# Patient Record
Sex: Male | Born: 1945 | Race: White | Hispanic: No | Marital: Married | Smoking: Former smoker
Health system: Southern US, Community
[De-identification: ages and names within clinical notes are randomized; demographics above are authoritative.]

## PROBLEM LIST (undated history)

## (undated) DIAGNOSIS — J45909 Unspecified asthma, uncomplicated: Secondary | ICD-10-CM

## (undated) HISTORY — DX: Unspecified asthma, uncomplicated: J45.909

---

## 1999-06-02 ENCOUNTER — Encounter: Payer: Self-pay | Admitting: Gastroenterology

## 1999-06-02 ENCOUNTER — Ambulatory Visit (HOSPITAL_COMMUNITY): Admission: RE | Admit: 1999-06-02 | Discharge: 1999-06-02 | Payer: Self-pay | Admitting: Gastroenterology

## 1999-12-22 ENCOUNTER — Ambulatory Visit (HOSPITAL_COMMUNITY): Admission: RE | Admit: 1999-12-22 | Discharge: 1999-12-22 | Payer: Self-pay | Admitting: Gastroenterology

## 1999-12-22 ENCOUNTER — Encounter: Payer: Self-pay | Admitting: Gastroenterology

## 2002-11-02 ENCOUNTER — Encounter: Payer: Self-pay | Admitting: Family Medicine

## 2002-11-02 ENCOUNTER — Encounter: Admission: RE | Admit: 2002-11-02 | Discharge: 2002-11-02 | Payer: Self-pay | Admitting: Family Medicine

## 2002-12-04 ENCOUNTER — Ambulatory Visit (HOSPITAL_COMMUNITY): Admission: RE | Admit: 2002-12-04 | Discharge: 2002-12-04 | Payer: Self-pay | Admitting: Gastroenterology

## 2005-06-19 ENCOUNTER — Ambulatory Visit: Payer: Self-pay | Admitting: Pulmonary Disease

## 2005-07-03 ENCOUNTER — Ambulatory Visit (HOSPITAL_BASED_OUTPATIENT_CLINIC_OR_DEPARTMENT_OTHER): Admission: RE | Admit: 2005-07-03 | Discharge: 2005-07-03 | Payer: Self-pay | Admitting: Pulmonary Disease

## 2005-07-19 ENCOUNTER — Ambulatory Visit: Payer: Self-pay | Admitting: Pulmonary Disease

## 2007-01-13 DIAGNOSIS — G4733 Obstructive sleep apnea (adult) (pediatric): Secondary | ICD-10-CM

## 2007-01-13 DIAGNOSIS — E785 Hyperlipidemia, unspecified: Secondary | ICD-10-CM

## 2007-01-13 DIAGNOSIS — K222 Esophageal obstruction: Secondary | ICD-10-CM | POA: Insufficient documentation

## 2007-01-14 ENCOUNTER — Ambulatory Visit: Payer: Self-pay | Admitting: Pulmonary Disease

## 2008-07-14 ENCOUNTER — Encounter: Admission: RE | Admit: 2008-07-14 | Discharge: 2008-07-14 | Payer: Self-pay | Admitting: Family Medicine

## 2009-05-02 ENCOUNTER — Ambulatory Visit: Payer: Self-pay | Admitting: Pulmonary Disease

## 2010-03-07 NOTE — Assessment & Plan Note (Signed)
Summary: rov for osa   CC:  OSA follow-up.  The patient states he is doing well on CPAP. He does need a new machine and mask.Marland Kitchen  History of Present Illness: the pt comes in today for f/u of his known osa.  He has been compliant with cpap, and continues to see benefit wrt sleep and daytime alertness.  He is due for a new machine and supplies.  His weight is stable from the last visit in 2008.  Current Medications (verified): 1)  Zocor 40 Mg  Tabs (Simvastatin) .... Take One Tab By Mouth Once Daily 2)  Zantac 150 Mg  Caps (Ranitidine Hcl) .... Take One Tab By Mouth Once Daily As Needed  Allergies (verified): 1)  ! Penicillin  Review of Systems      See HPI  Vital Signs:  Patient profile:   65 year old male Height:      71 inches (180.34 cm) Weight:      229.50 pounds (104.32 kg) BMI:     32.12 O2 Sat:      94 % on Room air Temp:     97.9 degrees F (36.61 degrees C) oral Pulse rate:   61 / minute BP sitting:   118 / 80  (left arm) Cuff size:   regular  Vitals Entered By: Michel Bickers CMA (May 02, 2009 11:32 AM)  O2 Sat at Rest %:  94 O2 Flow:  Room air  Physical Exam  General:  ow male in nad Nose:  no skin breakdown or pressure necrosis from cpap mask Neurologic:  alert, not sleepy, moves all 4.   Impression & Recommendations:  Problem # 1:  OBSTRUCTIVE SLEEP APNEA (ICD-327.23) the pt is doing well with cpap, but needs to work on weight loss.  He is due for new machine and supplies, and will send an order to his dme.  He will f/u in one year.  Other Orders: Est. Patient Level II (33295) DME Referral (DME)  Patient Instructions: 1)  work on weight loss 2)  will get you a new cpap machine 3)  followup with me in 12mos.   Immunization History:  Influenza Immunization History:    Influenza:  historical (11/05/2008)  Pneumovax Immunization History:    Pneumovax:  historical (11/05/2008)

## 2010-06-23 NOTE — Procedures (Signed)
[place]  Patient:    Keith Harris, Keith Harris                       MRN: [id number] Proc. Date: 06/02/99 Adm. Date:  [id number] Attending:  Louie Bun CC:         Desma Maxim, M.D.                           Procedure Report  PROCEDURE:  Esophagogastroduodenoscopy with esophageal dilatation.  DESCRIPTION OF PROCEDURE:  The patient was placed in the left lateral decubitus  position and placed on the pulse monitor with continuous low flow oxygen delivered by nasal cannula.  He was sedated with 60 mg of IV Demerol and 7 mg of IV Versed. The Olympus video endoscope was advanced under direct vision into the oropharynx and esophagus.  The esophagus was straight and of normal caliber at the squamocolumnar line at 38 cm.  There did appear to be a fairly pronounced short  lower esophageal stricture that did not present significant resistance to passage of the scope beyond it but, when this was done, there was some bleeding from the vicinity of the stricture.  There were no visible erosions, ulcers or areas of exudate.  There was no definite hiatal hernia seen.  The stomach was entered and a small amount of liquid secretions were suctioned from the fundus.  A retroflexed view of the cardia was unremarkable.  The fundus, body, antrum and pylorus all appeared normal.  The duodenum was entered, and both the bulb and second portion were well inspected and appeared to be within normal limits.  The Savary guidewire was passed through the endoscope channel and the scope withdrawn.  Savary dilators of 15 and 16 mm diameter were passed consecutively under fluoroscopic visualization and moderate resistance, with blood seen on the last dilator only.  This dilator was removed together with the wire and the patient returned to the recovery room in stable condition.  He tolerated the procedure well and there were no  immediate complications.  IMPRESSION:  Esophageal stricture status post dilatation to 16 mm.  PLAN:  Consider proton pump inhibitor and, if complete resolution of dysphagia ot achieved within 6-8 weeks, will consider repeat EGD with dilatation to a greater diameter. DD:  06/02/99 TD:  06/02/99 Job: [id number]

## 2010-06-23 NOTE — Op Note (Signed)
[facility]  Patient:    Keith Harris, Keith Harris                       MRN: [id number] Proc. Date: 12/22/99 Adm. Date:  [id number] Attending:  Louie Bun CC:         Desma Maxim, M.D.   Operative Report  PROCEDURE:  Esophagogastroduodenoscopy with esophageal dilatation.  ENDOSCOPIST:  Everardo All. Madilyn Fireman, M.D.  INDICATIONS FOR PROCEDURE:  Patient with history of esophageal stricture dilated to 16 mm two months ago with some recurrent dysphagia, with intent of dilating him to 17 or possibly 18 mm for further improvement in his dysphagia.  DESCRIPTION OF PROCEDURE:   The patient was placed in the left lateral decubitus position and placed on the pulse monitor with continuous low-flow oxygen delivered by nasal cannula.  He was sedated with 80 mg IV Demerol and 7.5 mg IV Versed.  The Olympus video endoscope was advanced under direct vision into the oropharynx and esophagus.  The esophagus was straight and of normal caliber with the squamocolumnar line at 38 cm.  There appeared to be a short fibrotic narrowing that was fairly patent at the GE junction that did not present any resistance to passage of the endoscope tip through it.  There were no visible erosions or ulcers.  No hiatal hernia was appreciated.  The stomach was entered, and a small amount of liquid secretion was suctioned from the fundus.  Retroflex view of the cardia was unremarkable.  The fundus body, antrum, and pylorus all appeared normal.  The duodenum was entered and both bulbs second portion were well inspected and appeared to be within normal limits.  A Savary guidewire was placed through the endoscope channel and the scope withdrawn.  Savary dilators of 16 and 17 mm were passed under fluoroscopic visualization with some blood noted on the second dilator.  That dilator was removed together with the wire, and the patient returned to the recovery room in stable condition.  He tolerated the  procedure well, and there were no immediate complications.  IMPRESSION:   Esophageal stricture dilated to 17 mm.  PLAN:  Advance diet and observe response to dilatation.  Will continue Prevacid for now. DD:  12/22/99 TD:  12/22/99 Job: [id number]

## 2010-06-23 NOTE — Op Note (Signed)
   NAME:  Keith Harris, Keith Harris                          ACCOUNT NO.:  [id number]   MEDICAL RECORD NO.:  [id number]                   PATIENT TYPE:  AMB   LOCATION:  ENDO                                 FACILITY:  [facility]   PHYSICIAN:  John C. Madilyn Fireman, M.D.                 DATE OF BIRTH:  July 20, 1945   DATE OF PROCEDURE:  12/04/2002  DATE OF DISCHARGE:                                 OPERATIVE REPORT   PROCEDURE:  Colonoscopy.   INDICATIONS FOR PROCEDURE:  Colon cancer screening in a 65 year old patient  with no prior screening.   DESCRIPTION OF PROCEDURE:  The patient was placed in the left lateral  decubitus position and placed on the pulse monitor with continuous low-flow  oxygen delivered by nasal cannula.  He was sedated with 25 mcg IV fentanyl  and 2 mg IV Versed in addition to the medicine given for the previous EGD.  The Olympus video colonoscope was inserted into the rectum and advanced to  the cecum, confirmed by transillumination at McBurney's point and  visualization of the ileocecal valve and appendiceal orifice.  The prep was  excellent.  The cecum, ascending, transverse, descending, and sigmoid colon  all appeared normal with no masses, polyps, diverticula, or other mucosal  abnormalities.  The rectum likewise appeared normal, and retroflexed view of  the anus revealed no obvious internal hemorrhoids.  The colonoscope was then  withdrawn, and the patient returned to the recovery room in stable  condition.  He tolerated the procedure well, and there were no immediate  complications.   IMPRESSION:  Normal screening colonoscopy.   PLAN:  Next colon screening by sigmoidoscopy in five years.                                               John C. Madilyn Fireman, M.D.    JCH/MEDQ  D:  12/04/2002  T:  12/04/2002  Job:  [id number]   cc:   Donia Guiles, M.D.  [address] [place]  [place] [address]  Fax: [phone]

## 2010-06-23 NOTE — Op Note (Signed)
   NAME:  Keith Harris, Keith Harris                          ACCOUNT NO.:  [id number]   MEDICAL RECORD NO.:  [id number]                   PATIENT TYPE:  AMB   LOCATION:  ENDO                                 FACILITY:  [facility]   PHYSICIAN:  John C. Madilyn Fireman, M.D.                 DATE OF BIRTH:  [birth year]   DATE OF PROCEDURE:  12/04/2002  DATE OF DISCHARGE:                                 OPERATIVE REPORT   PROCEDURE:  Esophagogastroduodenoscopy.   INDICATION FOR PROCEDURE:  History of esophageal stricture with recurrent  dysphagia.   DESCRIPTION OF PROCEDURE:  The patient was placed in the left lateral  decubitus position and placed on the pulse monitor with continuous low-flow  oxygen delivered by nasal cannula.  He was sedated with 62.5 mcg IV fentanyl  and 7 mg IV Versed.  The Olympus video endoscope was advanced under direct  vision into the oropharynx and esophagus.  The esophagus was straight and of  normal caliber with the squamocolumnar line at 38 cm.  There was a possible  subtle mild narrowing of the GE junction but no visible inflammation or  visible fibrosis.  There was no discrete ring.  The scope passed through the  GE junction without resistance and a small amount of liquid secretions were  suctioned from the fundus.  Retroflexed view of the cardia was unremarkable.  The fundus, body, antrum, and pylorus all appeared normal.  The duodenum was  entered and there was a mild appearance of bulbar duodenitis with no focal  erosions or ulcers.  The postbulbar duodenum appeared normal.  The Savary  guidewire was then placed through the endoscope channel and the scope  withdrawn.  A single 17 mm Savary dilator was passed over the guidewire with  moderate resistance and a small amount of blood seen on withdrawal.  The  dilator was removed together with the wire and the patient returned to the  recovery room in stable condition.  He tolerated the procedure well, and  there were no immediate  complications.   IMPRESSION:  Subtle lower esophageal stricture.   PLAN:  Advance diet and observe response to dilatation, and will proceed  with colonoscopy as scheduled.                                               John C. Madilyn Fireman, M.D.    JCH/MEDQ  D:  12/04/2002  T:  12/04/2002  Job:  [id number]   cc:   Donia Guiles, M.D.  [address] [place]  [place] [address]  Fax: [phone]

## 2010-06-23 NOTE — Procedures (Signed)
NAMEDUSTEN, ELLINWOOD NO.:  [id number]   MEDICAL RECORD NO.:  [id number]          PATIENT TYPE:  OUT   LOCATION:  SLEEP CENTER                 FACILITY:  [facility]   PHYSICIAN:  Marcelyn Bruins, M.D. [facility] DATE OF BIRTH:  Jun 15, 1945   DATE OF STUDY:  07/03/2005                              NOCTURNAL POLYSOMNOGRAM   REFERRING PHYSICIAN:  Dr. Marcelyn Bruins.   INDICATION FOR STUDY:  Hypersomnia with sleep apnea.   EPWORTH SCORE:  13.   SLEEP ARCHITECTURE:  The patient had a total sleep time of 361 minutes with  very little REM or slow wave sleep.  Sleep onset latency was prolonged at 35  minutes and REM onset was very prolonged at 234 minutes.  Sleep efficiency  was decreased 77%.   RESPIRATORY DATA:  The patient underwent split night protocol where he was  found to have 36 obstructive events in the first 148 minutes of sleep.  This  gave him a respiratory disturbance index of 15 events per hour and moderate  snoring was noted throughout.  By protocol, the patient was then placed on a  large ResMed Quattro full face mask and CPAP titration was initiated.  The  patient was titrated as high as 19 cm of water pressure because of  breakthrough snoring; however, the a question was raised regarding its  validity because the patient was adjusting his mask throughout the  titration.   OXYGEN DATA:  The patient had O2 desaturation as low as 89% with his  obstructive events.   CARDIAC DATA:  No clinically significant cardiac arrhythmias.   MOVEMENT-PARASOMNIAS:  The patient was found to have 95 leg jerks with 1.8  per hour resulting in arousal or awakening.   IMPRESSION-RECOMMENDATIONS:  1.  Split night study reveals mild obstructive sleep apnea with a      respiratory disturbance index of 15 events per hour and O2 desaturation      as low as 89%.  The patient was then placed on a large ResMed Quattro      mask and titrated to 19 cm of water pressure to control both events  and      snoring.  The accuracy of the titration is in question due to the      patient adjusting his mask throughout.  The very high pressure needs are      out of her portion to the patient's respiratory disturbance index may be      due to significant mask leaks coming from the patient's adjustments.  I      would suggest either having the patient return for formal titration      versus an auto titrate device      as an outpatient for optimization.  Clinical correlation is suggested.  2.  Moderate numbers of leg jerks with very little sleep disruption.           ______________________________  Marcelyn Bruins, M.D. [facility]  Diplomate, American Board of Sleep  Medicine     KC/MEDQ  D:  07/19/2005 20:51:26  T:  07/19/2005 23:45:38  Job:  [id number]

## 2010-07-19 IMAGING — CR DG CHEST 2V
2 series · 2 of 2 positions shown · non-contrast
Comparison: None

CLINICAL DATA: Cough and fever.

CHEST - 2 VIEW

[w chest pa]
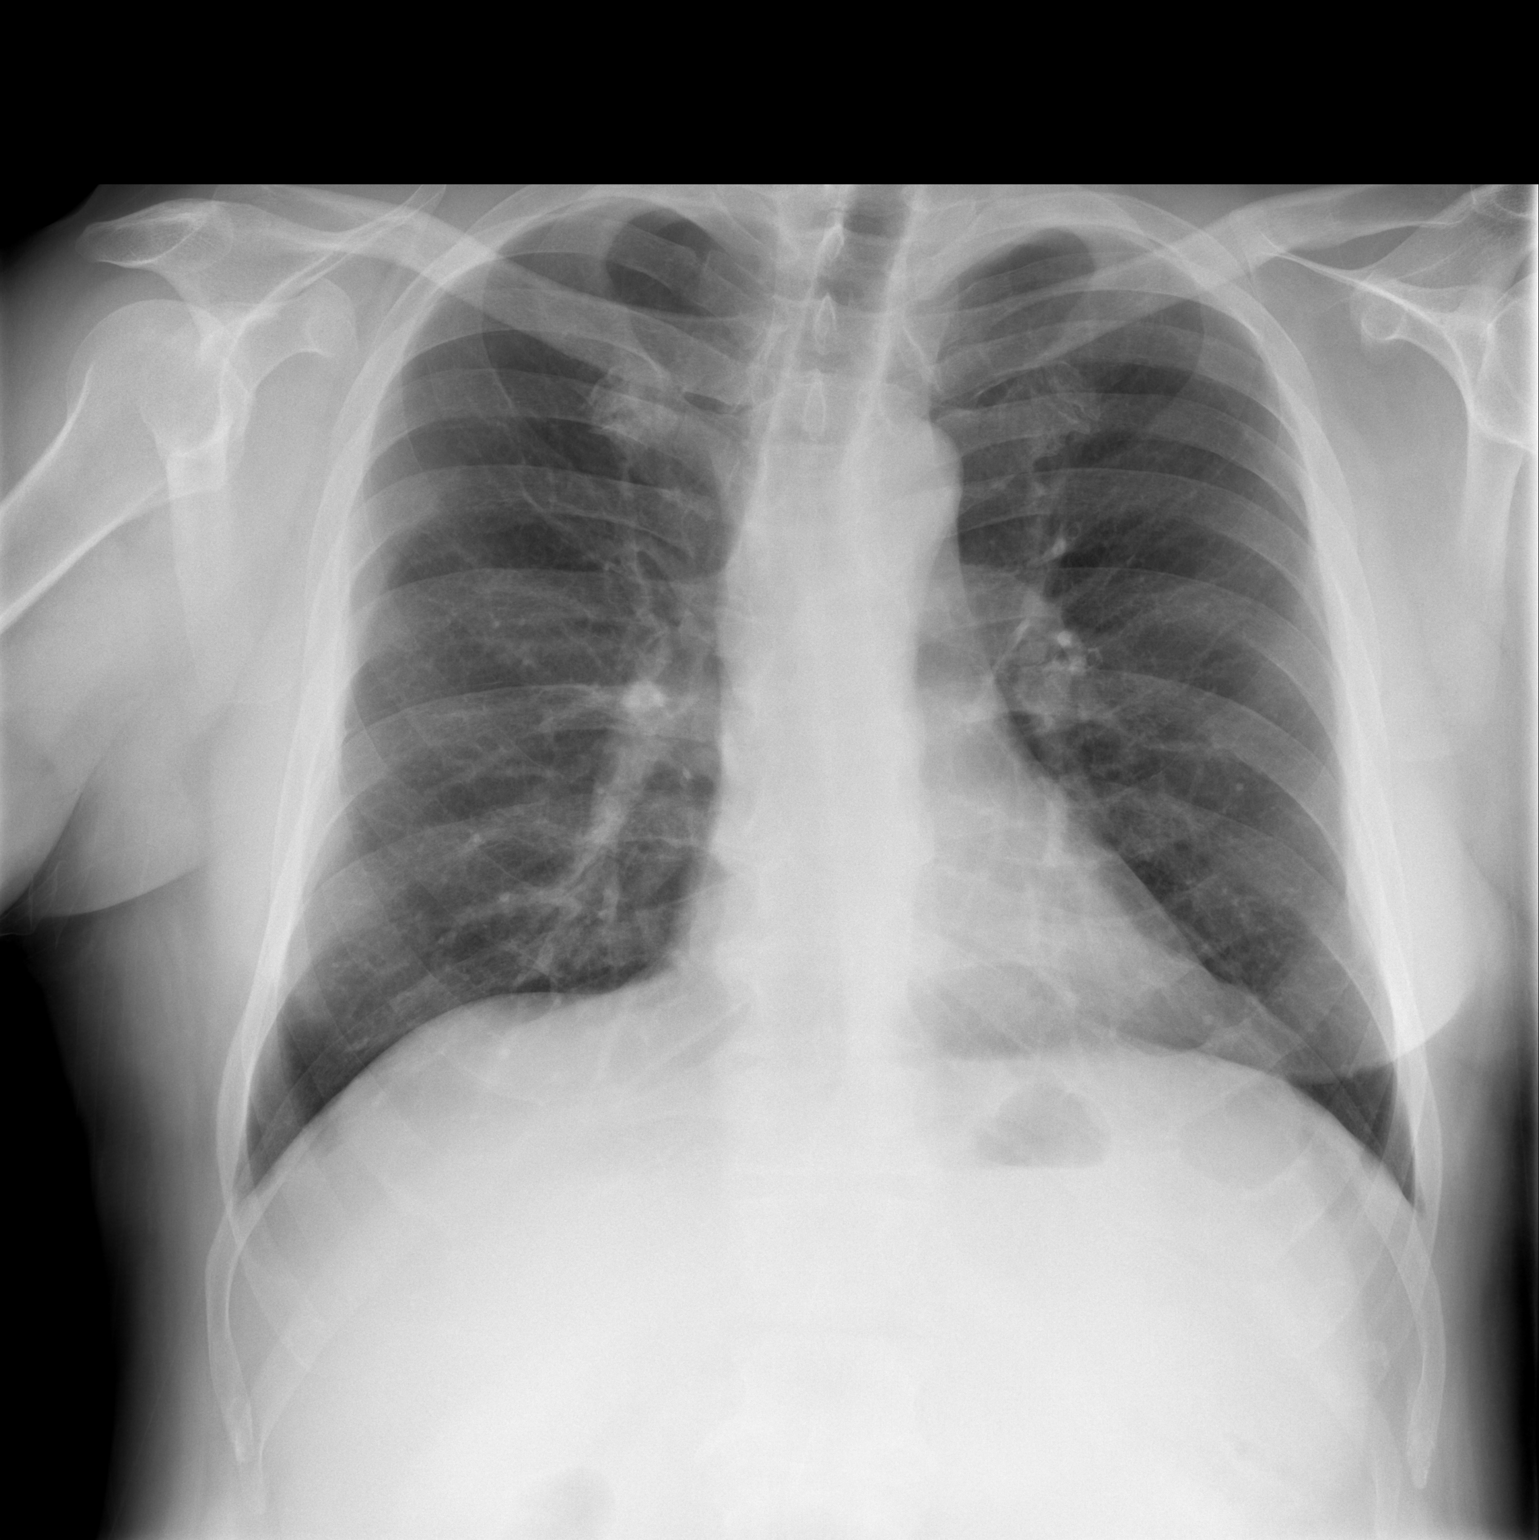

[w chest lat]
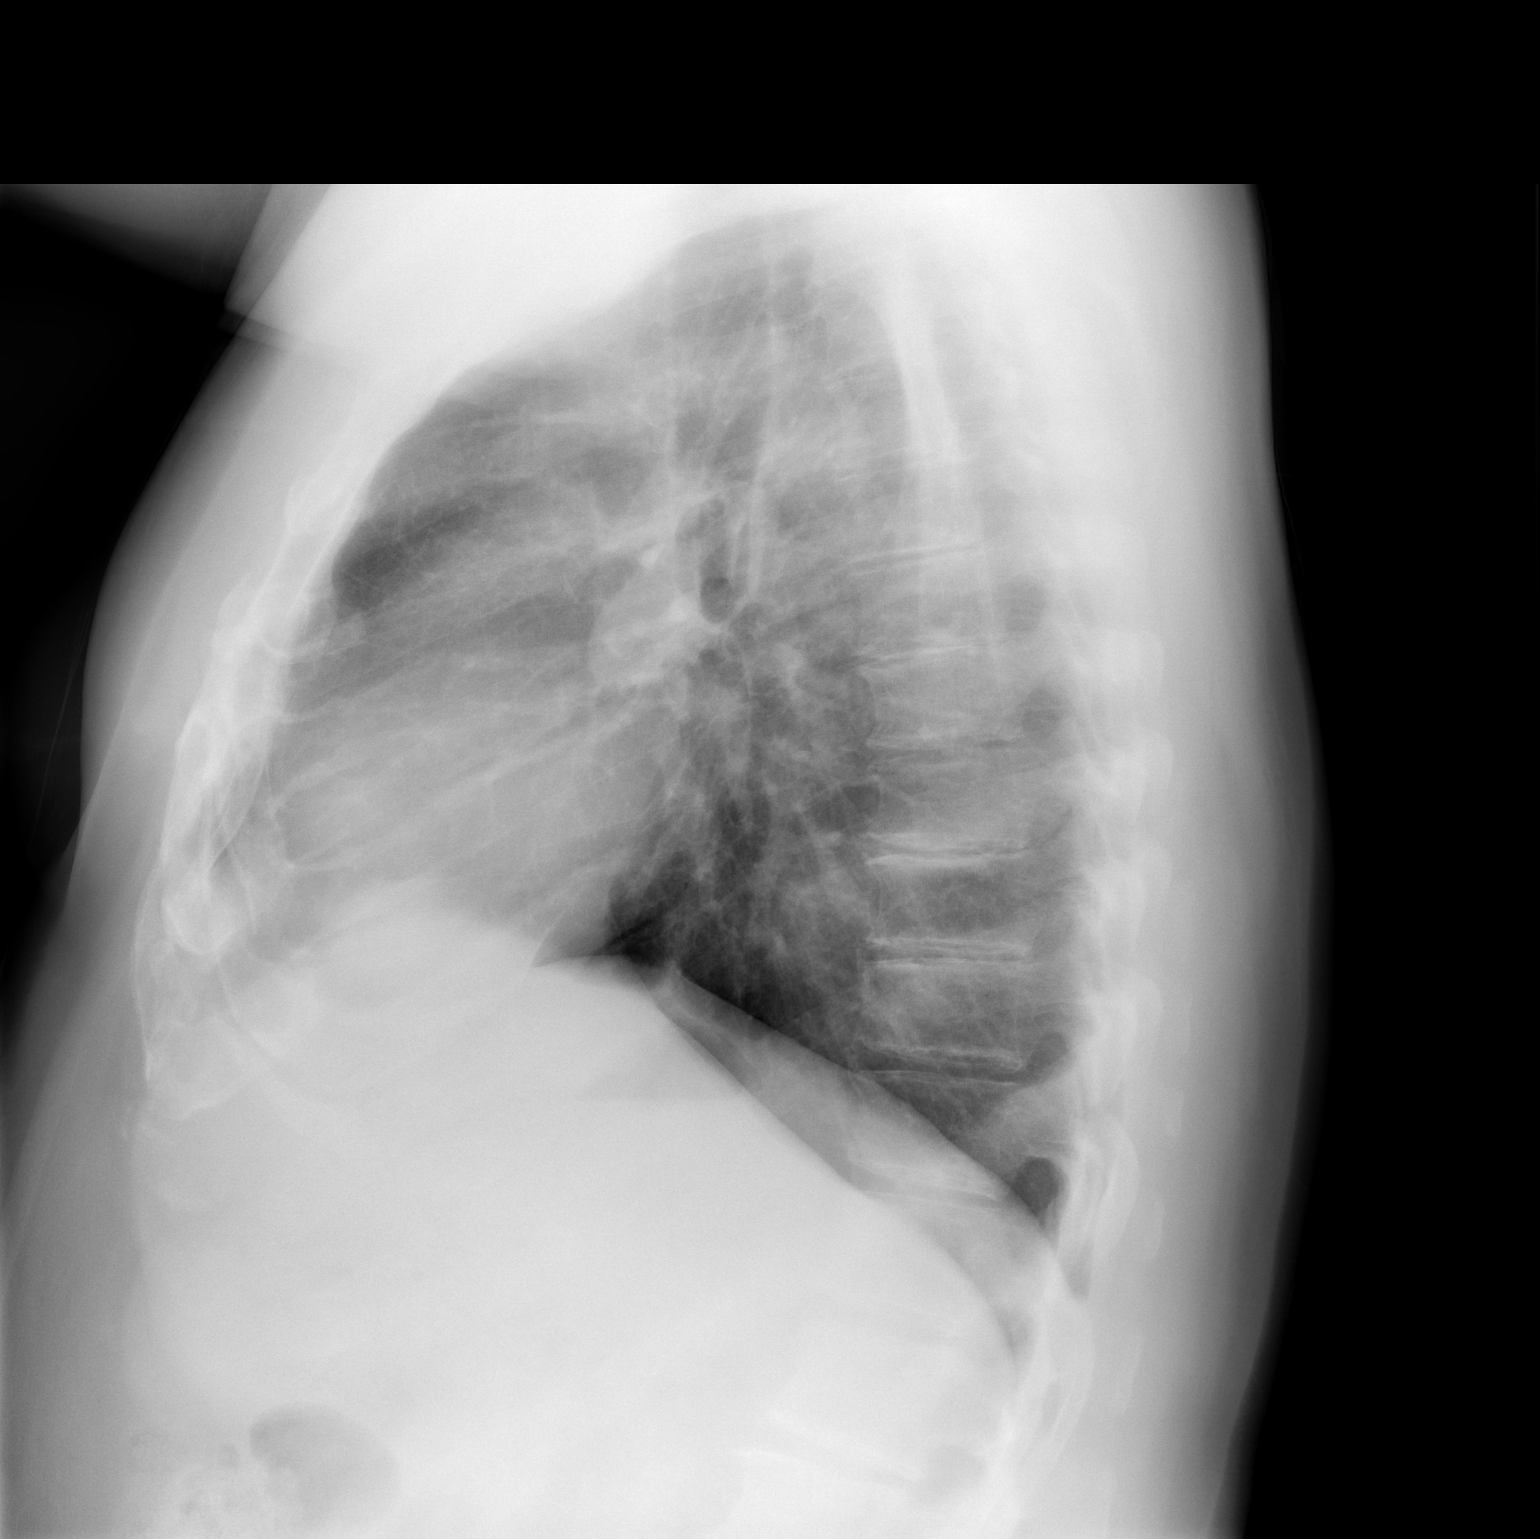

[2 of 2 positions shown; findings below may reference images not displayed]

FINDINGS: Trachea is midline.  Heart size normal.  Lungs are clear.
No pleural fluid.
IMPRESSION: No acute findings.

## 2011-06-18 ENCOUNTER — Telehealth: Payer: Self-pay | Admitting: Pulmonary Disease

## 2011-06-18 NOTE — Telephone Encounter (Signed)
I spoke with natalie and she is requesting pt's last sleep study. He is going to have a pending right knee scope. Pt last OV was 05/02/2009 and told to f/u in 1 year. No pending apts. She would like this sent to [phone] attn: natalie. Please advise KC thanks

## 2011-06-18 NOTE — Telephone Encounter (Signed)
Ok with me 

## 2011-06-19 NOTE — Telephone Encounter (Signed)
Sleep study printed from epic and faxed to the number provided attn Natalie.  Called spoke with Dorene Grebe to make her aware.  Nothing further needed at this time; will sign off.

## 2013-07-27 ENCOUNTER — Ambulatory Visit
Admission: RE | Admit: 2013-07-27 | Discharge: 2013-07-27 | Disposition: A | Discharge status: Home / Self Care | Payer: 59 | Source: Ambulatory Visit | Attending: Family Medicine | Admitting: Family Medicine

## 2013-07-27 ENCOUNTER — Other Ambulatory Visit: Payer: Self-pay | Admitting: Family Medicine

## 2013-07-27 DIAGNOSIS — R059 Cough, unspecified: Secondary | ICD-10-CM

## 2013-07-27 DIAGNOSIS — R05 Cough: Secondary | ICD-10-CM

## 2013-09-03 ENCOUNTER — Ambulatory Visit
Admission: RE | Admit: 2013-09-03 | Discharge: 2013-09-03 | Disposition: A | Discharge status: Home / Self Care | Payer: 59 | Source: Ambulatory Visit | Attending: Family Medicine | Admitting: Family Medicine

## 2013-09-03 ENCOUNTER — Other Ambulatory Visit: Payer: Self-pay | Admitting: Family Medicine

## 2013-09-03 DIAGNOSIS — R634 Abnormal weight loss: Secondary | ICD-10-CM

## 2013-09-03 MED ORDER — IOHEXOL 300 MG/ML  SOLN
100.0000 mL | Freq: Once | INTRAMUSCULAR | Status: AC | PRN
  Administered 2013-09-03: 100 mL via INTRAVENOUS

## 2013-09-14 ENCOUNTER — Telehealth: Payer: Self-pay | Admitting: Pulmonary Disease

## 2013-09-15 NOTE — Telephone Encounter (Signed)
No message taken appt scheduled with RA 8.13.15 Will sign off

## 2013-09-17 ENCOUNTER — Ambulatory Visit (INDEPENDENT_AMBULATORY_CARE_PROVIDER_SITE_OTHER): Payer: 59 | Admitting: Pulmonary Disease

## 2013-09-17 ENCOUNTER — Encounter (INDEPENDENT_AMBULATORY_CARE_PROVIDER_SITE_OTHER): Payer: Self-pay

## 2013-09-17 ENCOUNTER — Encounter: Payer: Self-pay | Admitting: Pulmonary Disease

## 2013-09-17 VITALS — BP 134/72 | HR 81 | Temp 98.0°F | Ht 71.0 in | Wt 204.0 lb

## 2013-09-17 DIAGNOSIS — G4733 Obstructive sleep apnea (adult) (pediatric): Secondary | ICD-10-CM

## 2013-09-17 DIAGNOSIS — R059 Cough, unspecified: Secondary | ICD-10-CM

## 2013-09-17 DIAGNOSIS — R05 Cough: Secondary | ICD-10-CM

## 2013-09-17 DIAGNOSIS — R053 Chronic cough: Secondary | ICD-10-CM | POA: Insufficient documentation

## 2013-09-17 MED ORDER — DEXLANSOPRAZOLE 60 MG PO CPDR: 60.0000 mg | DELAYED_RELEASE_CAPSULE | Freq: Every day | ORAL | Status: DC

## 2013-09-17 MED ORDER — NYSTATIN 100000 UNIT/ML MT SUSP: 5.0000 mL | Freq: Two times a day (BID) | OROMUCOSAL | Status: AC

## 2013-09-17 NOTE — Assessment & Plan Note (Signed)
New fullface mask was ordered
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
## 2013-09-17 NOTE — Assessment & Plan Note (Signed)
Your cough is likely due to reflux, may be sinus -related STOP symbicort Nystatin 5 ml swish & swallow for your hoarse voice Take DEXILANT daily am -Rx will be sent Take zantac daily bedtime We discussed other measures for reflux Take DELSYM cough syrup twice daily (daytime ) as needed  rx for new full face mask will be sent to [facility]

## 2013-09-17 NOTE — Patient Instructions (Signed)
Your cough is likely due to reflux, may be sinus -related STOP symbicort Nystatin 5 ml swish & swallow for your hoarse voice Take DEXILANT daily am -Rx will be sent Take zantac daily bedtime We discussed other measures for reflux Take DELSYM cough syrup twice daily (daytime ) as needed OK to take codeine cough syrup at bedtime rx for new full face mask will be sent to [facility]

## 2013-10-16 ENCOUNTER — Institutional Professional Consult (permissible substitution): Payer: 59 | Admitting: Pulmonary Disease

## 2013-11-28 ENCOUNTER — Other Ambulatory Visit: Payer: Self-pay | Admitting: Pulmonary Disease

## 2015-08-01 IMAGING — CR DG CHEST 2V
2 series · 2 of 2 positions shown · non-contrast
Comparison: Two-view chest 07/14/2008

CLINICAL DATA: Cough.  Chest congestion.  Fever.

EXAM:
CHEST  2 VIEW

[view not recorded (1 of 2)]
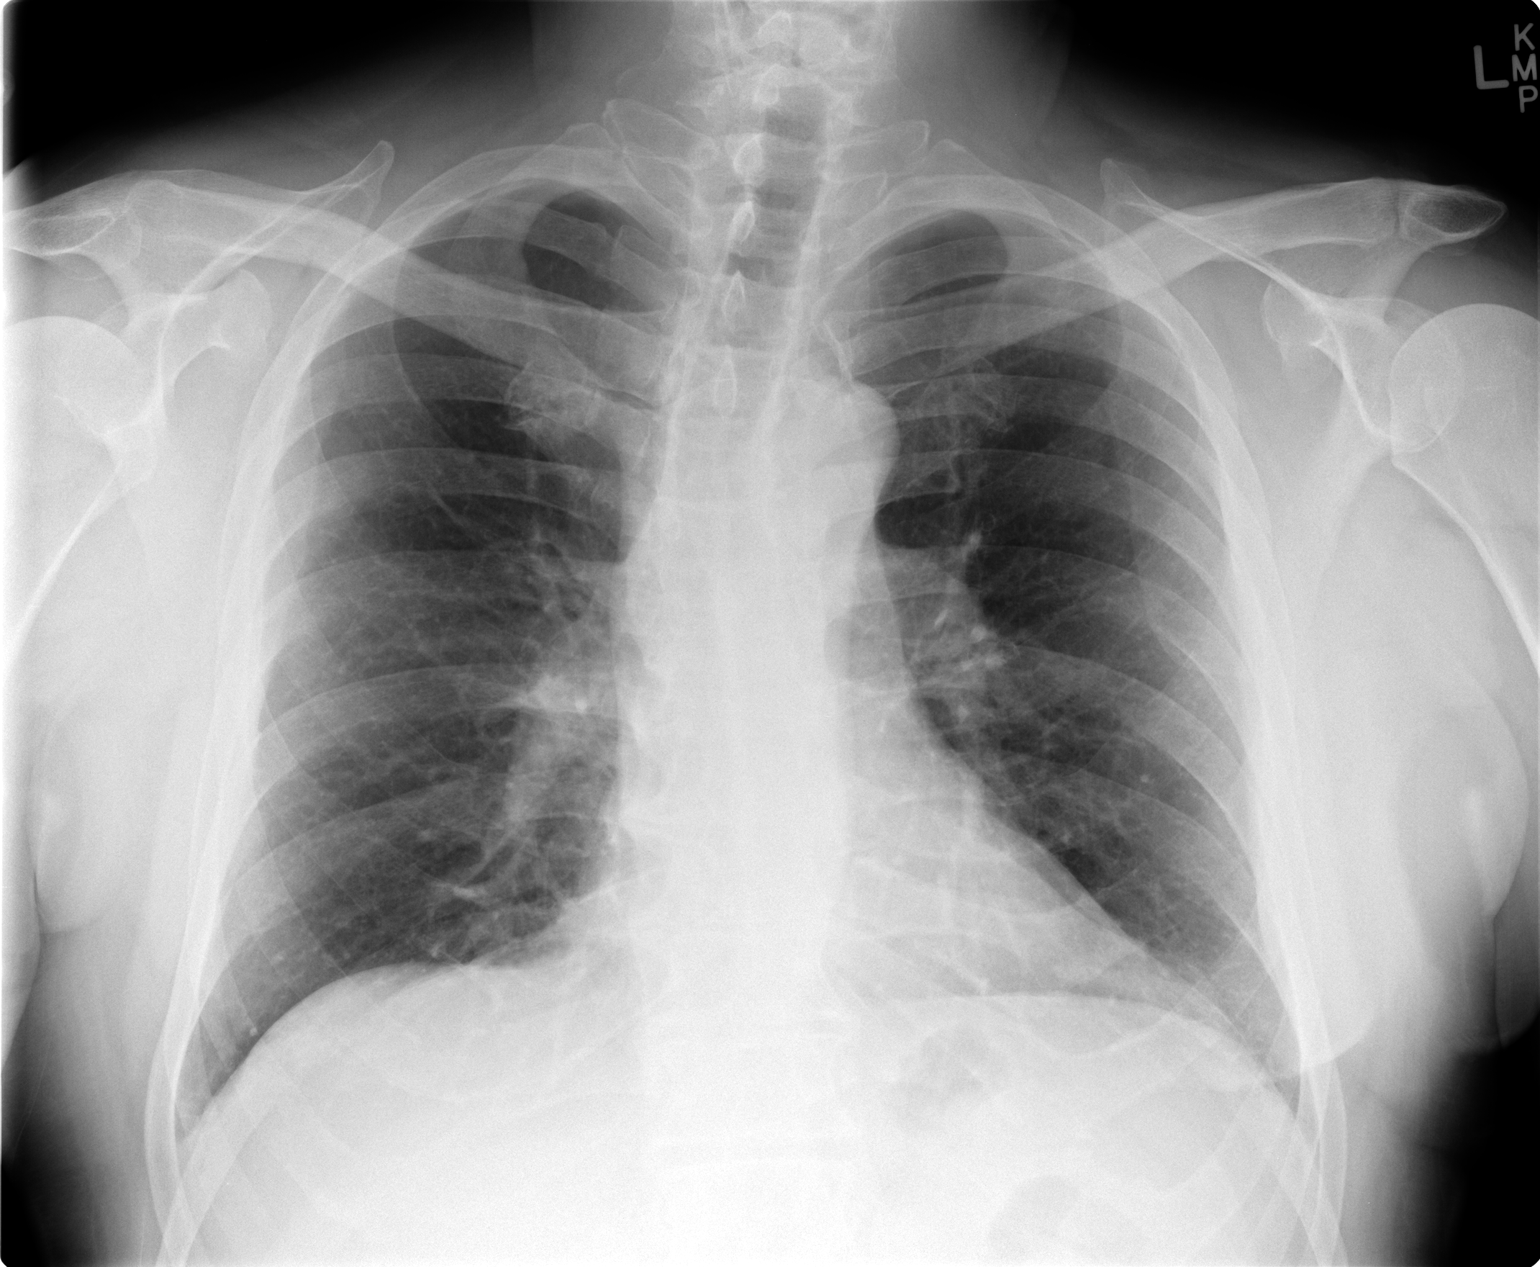

[view not recorded (2 of 2)]
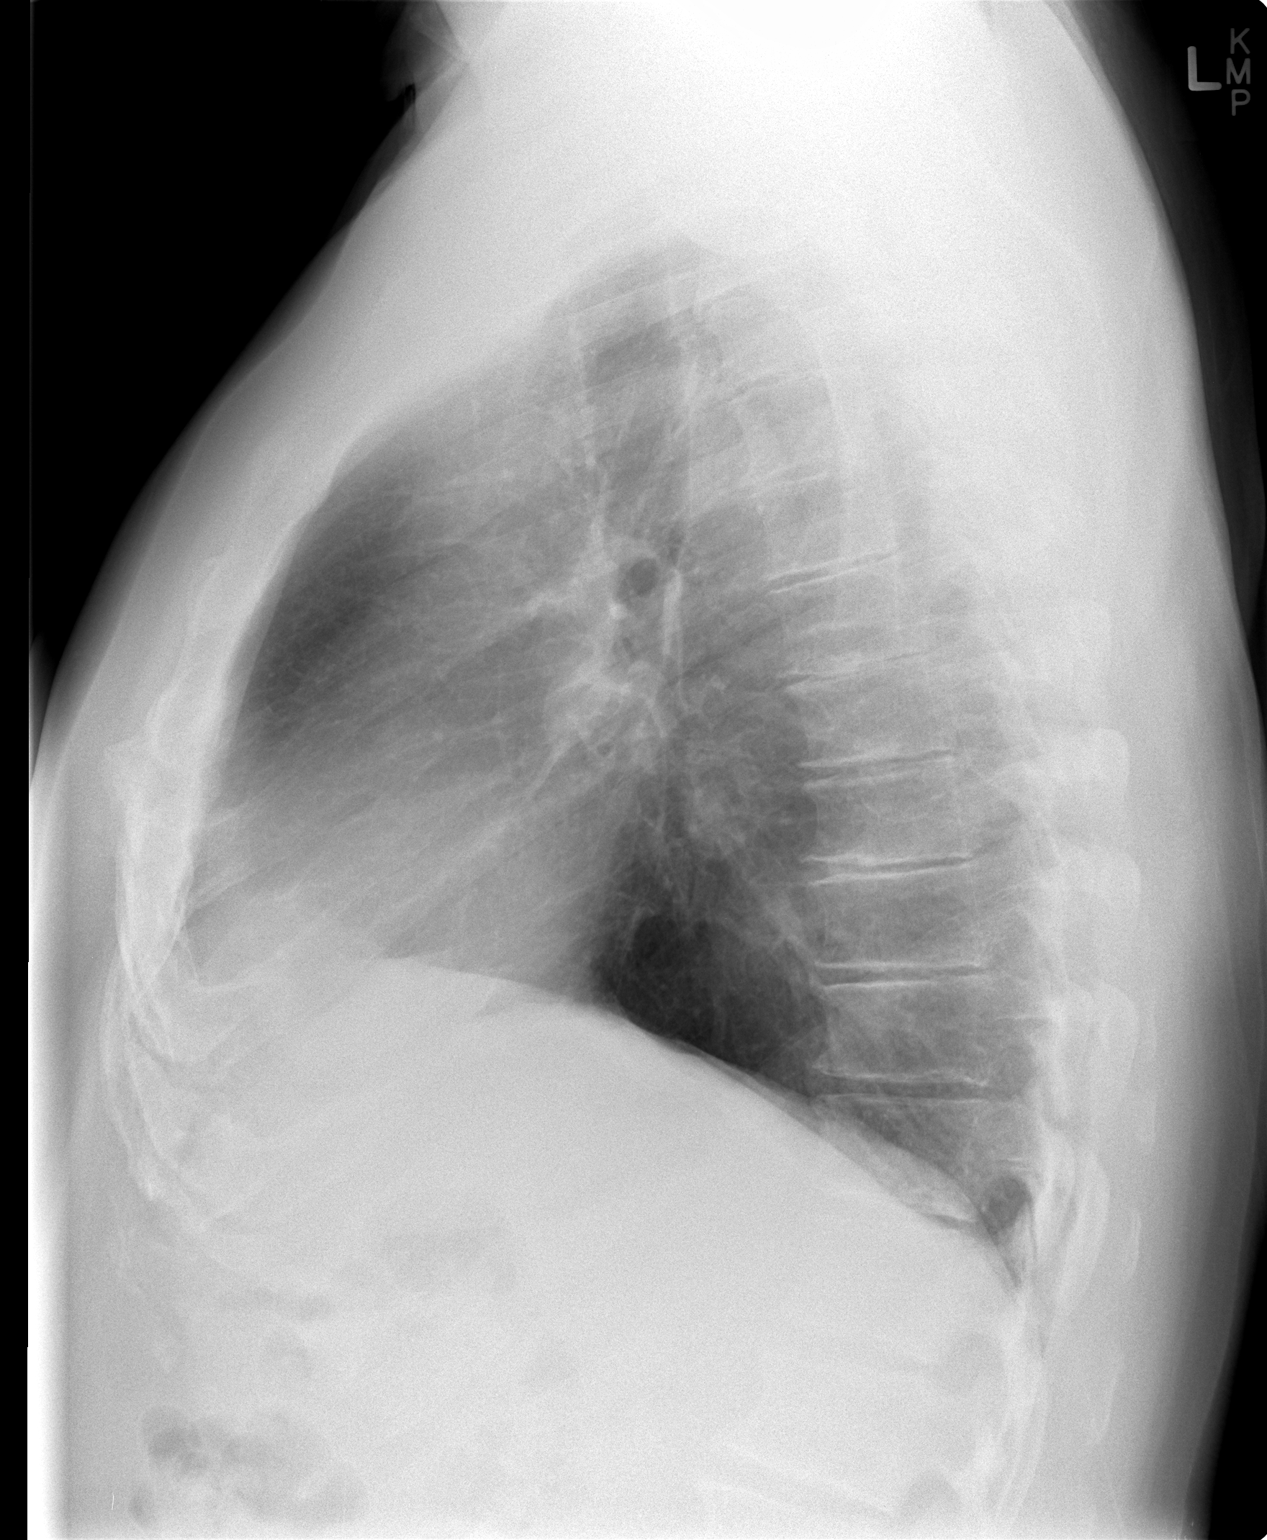

[2 of 2 positions shown; findings below may reference images not displayed]

FINDINGS: The heart size is normal. The lungs are clear. There is some scratch
the mild emphysematous changes are noted. Degenerative changes of
the thoracic spine show slight progression.
IMPRESSION: 1. No acute cardiopulmonary disease.
2. Stable emphysematous change.

## 2015-12-28 ENCOUNTER — Other Ambulatory Visit: Payer: Self-pay | Admitting: Urology

## 2016-01-23 ENCOUNTER — Encounter (HOSPITAL_BASED_OUTPATIENT_CLINIC_OR_DEPARTMENT_OTHER): Admission: RE | Payer: Self-pay | Source: Ambulatory Visit

## 2016-01-23 ENCOUNTER — Ambulatory Visit (HOSPITAL_BASED_OUTPATIENT_CLINIC_OR_DEPARTMENT_OTHER): Admission: RE | Admit: 2016-01-23 | Payer: 59 | Source: Ambulatory Visit | Admitting: Urology

## 2016-01-23 SURGERY — NESBIT PROCEDURE
Anesthesia: General

## 2016-09-04 DIAGNOSIS — E782 Mixed hyperlipidemia: Secondary | ICD-10-CM | POA: Diagnosis not present

## 2016-09-04 DIAGNOSIS — R7301 Impaired fasting glucose: Secondary | ICD-10-CM | POA: Diagnosis not present

## 2016-09-04 DIAGNOSIS — E669 Obesity, unspecified: Secondary | ICD-10-CM | POA: Diagnosis not present

## 2017-03-20 DIAGNOSIS — Z125 Encounter for screening for malignant neoplasm of prostate: Secondary | ICD-10-CM | POA: Diagnosis not present

## 2017-03-20 DIAGNOSIS — Z6832 Body mass index (BMI) 32.0-32.9, adult: Secondary | ICD-10-CM | POA: Diagnosis not present

## 2017-03-20 DIAGNOSIS — R972 Elevated prostate specific antigen [PSA]: Secondary | ICD-10-CM | POA: Diagnosis not present

## 2017-03-20 DIAGNOSIS — E669 Obesity, unspecified: Secondary | ICD-10-CM | POA: Diagnosis not present

## 2017-03-20 DIAGNOSIS — E782 Mixed hyperlipidemia: Secondary | ICD-10-CM | POA: Diagnosis not present

## 2017-03-20 DIAGNOSIS — N400 Enlarged prostate without lower urinary tract symptoms: Secondary | ICD-10-CM | POA: Diagnosis not present

## 2017-03-20 DIAGNOSIS — N486 Induration penis plastica: Secondary | ICD-10-CM | POA: Diagnosis not present

## 2017-03-20 DIAGNOSIS — Z23 Encounter for immunization: Secondary | ICD-10-CM | POA: Diagnosis not present

## 2017-03-20 DIAGNOSIS — R7301 Impaired fasting glucose: Secondary | ICD-10-CM | POA: Diagnosis not present

## 2017-03-20 DIAGNOSIS — Z1159 Encounter for screening for other viral diseases: Secondary | ICD-10-CM | POA: Diagnosis not present

## 2017-03-20 DIAGNOSIS — Z0000 Encounter for general adult medical examination without abnormal findings: Secondary | ICD-10-CM | POA: Diagnosis not present

## 2017-04-08 DIAGNOSIS — Z1159 Encounter for screening for other viral diseases: Secondary | ICD-10-CM | POA: Diagnosis not present

## 2017-06-26 DIAGNOSIS — E7800 Pure hypercholesterolemia, unspecified: Secondary | ICD-10-CM | POA: Diagnosis not present

## 2017-06-26 DIAGNOSIS — R7301 Impaired fasting glucose: Secondary | ICD-10-CM | POA: Diagnosis not present

## 2017-10-26 DIAGNOSIS — Z23 Encounter for immunization: Secondary | ICD-10-CM | POA: Diagnosis not present

## 2018-02-18 DIAGNOSIS — Z6832 Body mass index (BMI) 32.0-32.9, adult: Secondary | ICD-10-CM | POA: Diagnosis not present

## 2018-02-18 DIAGNOSIS — E782 Mixed hyperlipidemia: Secondary | ICD-10-CM | POA: Diagnosis not present

## 2018-02-18 DIAGNOSIS — E669 Obesity, unspecified: Secondary | ICD-10-CM | POA: Diagnosis not present

## 2018-02-18 DIAGNOSIS — R7301 Impaired fasting glucose: Secondary | ICD-10-CM | POA: Diagnosis not present

## 2018-08-12 DIAGNOSIS — Z125 Encounter for screening for malignant neoplasm of prostate: Secondary | ICD-10-CM | POA: Diagnosis not present

## 2018-08-12 DIAGNOSIS — E782 Mixed hyperlipidemia: Secondary | ICD-10-CM | POA: Diagnosis not present

## 2018-08-12 DIAGNOSIS — R7301 Impaired fasting glucose: Secondary | ICD-10-CM | POA: Diagnosis not present

## 2018-08-14 DIAGNOSIS — K449 Diaphragmatic hernia without obstruction or gangrene: Secondary | ICD-10-CM | POA: Diagnosis not present

## 2018-08-14 DIAGNOSIS — Z7189 Other specified counseling: Secondary | ICD-10-CM | POA: Diagnosis not present

## 2018-08-14 DIAGNOSIS — Z6831 Body mass index (BMI) 31.0-31.9, adult: Secondary | ICD-10-CM | POA: Diagnosis not present

## 2018-08-14 DIAGNOSIS — N486 Induration penis plastica: Secondary | ICD-10-CM | POA: Diagnosis not present

## 2018-08-14 DIAGNOSIS — Z0000 Encounter for general adult medical examination without abnormal findings: Secondary | ICD-10-CM | POA: Diagnosis not present

## 2018-08-14 DIAGNOSIS — E782 Mixed hyperlipidemia: Secondary | ICD-10-CM | POA: Diagnosis not present

## 2018-08-14 DIAGNOSIS — E669 Obesity, unspecified: Secondary | ICD-10-CM | POA: Diagnosis not present

## 2018-08-14 DIAGNOSIS — Z125 Encounter for screening for malignant neoplasm of prostate: Secondary | ICD-10-CM | POA: Diagnosis not present

## 2018-08-14 DIAGNOSIS — N400 Enlarged prostate without lower urinary tract symptoms: Secondary | ICD-10-CM | POA: Diagnosis not present

## 2018-08-14 DIAGNOSIS — R7301 Impaired fasting glucose: Secondary | ICD-10-CM | POA: Diagnosis not present

## 2019-02-17 DIAGNOSIS — Z6831 Body mass index (BMI) 31.0-31.9, adult: Secondary | ICD-10-CM | POA: Diagnosis not present

## 2019-02-17 DIAGNOSIS — E669 Obesity, unspecified: Secondary | ICD-10-CM | POA: Diagnosis not present

## 2019-02-17 DIAGNOSIS — E782 Mixed hyperlipidemia: Secondary | ICD-10-CM | POA: Diagnosis not present

## 2019-02-17 DIAGNOSIS — R7301 Impaired fasting glucose: Secondary | ICD-10-CM | POA: Diagnosis not present

## 2019-02-17 DIAGNOSIS — Z7189 Other specified counseling: Secondary | ICD-10-CM | POA: Diagnosis not present

## 2019-02-17 DIAGNOSIS — R030 Elevated blood-pressure reading, without diagnosis of hypertension: Secondary | ICD-10-CM | POA: Diagnosis not present

## 2019-02-18 DIAGNOSIS — R7309 Other abnormal glucose: Secondary | ICD-10-CM | POA: Diagnosis not present

## 2019-02-18 DIAGNOSIS — E782 Mixed hyperlipidemia: Secondary | ICD-10-CM | POA: Diagnosis not present

## 2019-03-10 DIAGNOSIS — Z87891 Personal history of nicotine dependence: Secondary | ICD-10-CM | POA: Diagnosis not present

## 2019-03-10 DIAGNOSIS — Z809 Family history of malignant neoplasm, unspecified: Secondary | ICD-10-CM | POA: Diagnosis not present

## 2019-03-10 DIAGNOSIS — E785 Hyperlipidemia, unspecified: Secondary | ICD-10-CM | POA: Diagnosis not present

## 2019-03-10 DIAGNOSIS — K219 Gastro-esophageal reflux disease without esophagitis: Secondary | ICD-10-CM | POA: Diagnosis not present

## 2019-03-10 DIAGNOSIS — R030 Elevated blood-pressure reading, without diagnosis of hypertension: Secondary | ICD-10-CM | POA: Diagnosis not present

## 2019-03-10 DIAGNOSIS — I951 Orthostatic hypotension: Secondary | ICD-10-CM | POA: Diagnosis not present

## 2019-03-10 DIAGNOSIS — Z823 Family history of stroke: Secondary | ICD-10-CM | POA: Diagnosis not present

## 2019-03-10 DIAGNOSIS — E669 Obesity, unspecified: Secondary | ICD-10-CM | POA: Diagnosis not present

## 2019-03-10 DIAGNOSIS — Z880 Allergy status to penicillin: Secondary | ICD-10-CM | POA: Diagnosis not present

## 2019-03-10 DIAGNOSIS — Z803 Family history of malignant neoplasm of breast: Secondary | ICD-10-CM | POA: Diagnosis not present

## 2019-03-19 DIAGNOSIS — Z23 Encounter for immunization: Secondary | ICD-10-CM | POA: Diagnosis not present

## 2019-08-19 DIAGNOSIS — Z0000 Encounter for general adult medical examination without abnormal findings: Secondary | ICD-10-CM | POA: Diagnosis not present

## 2019-08-19 DIAGNOSIS — E669 Obesity, unspecified: Secondary | ICD-10-CM | POA: Diagnosis not present

## 2019-08-19 DIAGNOSIS — Z125 Encounter for screening for malignant neoplasm of prostate: Secondary | ICD-10-CM | POA: Diagnosis not present

## 2019-08-19 DIAGNOSIS — N400 Enlarged prostate without lower urinary tract symptoms: Secondary | ICD-10-CM | POA: Diagnosis not present

## 2019-08-19 DIAGNOSIS — R7309 Other abnormal glucose: Secondary | ICD-10-CM | POA: Diagnosis not present

## 2019-08-19 DIAGNOSIS — E782 Mixed hyperlipidemia: Secondary | ICD-10-CM | POA: Diagnosis not present

## 2019-08-19 DIAGNOSIS — K449 Diaphragmatic hernia without obstruction or gangrene: Secondary | ICD-10-CM | POA: Diagnosis not present

## 2020-01-27 DIAGNOSIS — R69 Illness, unspecified: Secondary | ICD-10-CM | POA: Diagnosis not present

## 2020-02-19 DIAGNOSIS — E669 Obesity, unspecified: Secondary | ICD-10-CM | POA: Diagnosis not present

## 2020-02-19 DIAGNOSIS — R7301 Impaired fasting glucose: Secondary | ICD-10-CM | POA: Diagnosis not present

## 2020-02-19 DIAGNOSIS — E782 Mixed hyperlipidemia: Secondary | ICD-10-CM | POA: Diagnosis not present

## 2020-05-18 DIAGNOSIS — H5200 Hypermetropia, unspecified eye: Secondary | ICD-10-CM | POA: Diagnosis not present

## 2020-05-18 DIAGNOSIS — Z0100 Encounter for examination of eyes and vision without abnormal findings: Secondary | ICD-10-CM | POA: Diagnosis not present

## 2020-08-24 DIAGNOSIS — R972 Elevated prostate specific antigen [PSA]: Secondary | ICD-10-CM | POA: Diagnosis not present

## 2020-08-24 DIAGNOSIS — L989 Disorder of the skin and subcutaneous tissue, unspecified: Secondary | ICD-10-CM | POA: Diagnosis not present

## 2020-08-24 DIAGNOSIS — M25562 Pain in left knee: Secondary | ICD-10-CM | POA: Diagnosis not present

## 2020-08-24 DIAGNOSIS — E782 Mixed hyperlipidemia: Secondary | ICD-10-CM | POA: Diagnosis not present

## 2020-08-24 DIAGNOSIS — K449 Diaphragmatic hernia without obstruction or gangrene: Secondary | ICD-10-CM | POA: Diagnosis not present

## 2020-08-24 DIAGNOSIS — Z0000 Encounter for general adult medical examination without abnormal findings: Secondary | ICD-10-CM | POA: Diagnosis not present

## 2020-08-24 DIAGNOSIS — R7301 Impaired fasting glucose: Secondary | ICD-10-CM | POA: Diagnosis not present

## 2020-08-24 DIAGNOSIS — Z125 Encounter for screening for malignant neoplasm of prostate: Secondary | ICD-10-CM | POA: Diagnosis not present

## 2020-08-24 DIAGNOSIS — Z1211 Encounter for screening for malignant neoplasm of colon: Secondary | ICD-10-CM | POA: Diagnosis not present

## 2020-08-24 DIAGNOSIS — N400 Enlarged prostate without lower urinary tract symptoms: Secondary | ICD-10-CM | POA: Diagnosis not present

## 2020-08-24 DIAGNOSIS — E669 Obesity, unspecified: Secondary | ICD-10-CM | POA: Diagnosis not present

## 2020-08-29 DIAGNOSIS — M25562 Pain in left knee: Secondary | ICD-10-CM | POA: Diagnosis not present

## 2020-08-30 ENCOUNTER — Ambulatory Visit
Admission: RE | Admit: 2020-08-30 | Discharge: 2020-08-30 | Disposition: A | Discharge status: Home / Self Care | Payer: Medicare HMO | Source: Ambulatory Visit | Attending: Sports Medicine | Admitting: Sports Medicine

## 2020-08-30 ENCOUNTER — Other Ambulatory Visit: Payer: Self-pay | Admitting: Sports Medicine

## 2020-08-30 ENCOUNTER — Other Ambulatory Visit: Payer: Self-pay

## 2020-08-30 DIAGNOSIS — M25562 Pain in left knee: Secondary | ICD-10-CM | POA: Diagnosis not present

## 2020-09-07 DIAGNOSIS — K219 Gastro-esophageal reflux disease without esophagitis: Secondary | ICD-10-CM | POA: Diagnosis not present

## 2020-09-07 DIAGNOSIS — Z86010 Personal history of colonic polyps: Secondary | ICD-10-CM | POA: Diagnosis not present

## 2020-09-07 DIAGNOSIS — K5900 Constipation, unspecified: Secondary | ICD-10-CM | POA: Diagnosis not present

## 2020-09-29 DIAGNOSIS — D123 Benign neoplasm of transverse colon: Secondary | ICD-10-CM | POA: Diagnosis not present

## 2020-09-29 DIAGNOSIS — K5730 Diverticulosis of large intestine without perforation or abscess without bleeding: Secondary | ICD-10-CM | POA: Diagnosis not present

## 2020-09-29 DIAGNOSIS — K635 Polyp of colon: Secondary | ICD-10-CM | POA: Diagnosis not present

## 2020-09-29 DIAGNOSIS — Z86010 Personal history of colonic polyps: Secondary | ICD-10-CM | POA: Diagnosis not present

## 2020-10-10 DIAGNOSIS — U071 COVID-19: Secondary | ICD-10-CM | POA: Diagnosis not present

## 2021-02-24 DIAGNOSIS — R7301 Impaired fasting glucose: Secondary | ICD-10-CM | POA: Diagnosis not present

## 2021-02-24 DIAGNOSIS — E782 Mixed hyperlipidemia: Secondary | ICD-10-CM | POA: Diagnosis not present

## 2021-02-24 DIAGNOSIS — R972 Elevated prostate specific antigen [PSA]: Secondary | ICD-10-CM | POA: Diagnosis not present

## 2021-02-24 DIAGNOSIS — E669 Obesity, unspecified: Secondary | ICD-10-CM | POA: Diagnosis not present

## 2021-08-29 DIAGNOSIS — Z0000 Encounter for general adult medical examination without abnormal findings: Secondary | ICD-10-CM | POA: Diagnosis not present

## 2021-08-29 DIAGNOSIS — Z125 Encounter for screening for malignant neoplasm of prostate: Secondary | ICD-10-CM | POA: Diagnosis not present

## 2021-08-29 DIAGNOSIS — R7301 Impaired fasting glucose: Secondary | ICD-10-CM | POA: Diagnosis not present

## 2021-08-29 DIAGNOSIS — E669 Obesity, unspecified: Secondary | ICD-10-CM | POA: Diagnosis not present

## 2021-08-29 DIAGNOSIS — K449 Diaphragmatic hernia without obstruction or gangrene: Secondary | ICD-10-CM | POA: Diagnosis not present

## 2021-08-29 DIAGNOSIS — E782 Mixed hyperlipidemia: Secondary | ICD-10-CM | POA: Diagnosis not present

## 2021-08-29 DIAGNOSIS — N400 Enlarged prostate without lower urinary tract symptoms: Secondary | ICD-10-CM | POA: Diagnosis not present

## 2021-08-29 DIAGNOSIS — Z23 Encounter for immunization: Secondary | ICD-10-CM | POA: Diagnosis not present

## 2022-03-01 DIAGNOSIS — E782 Mixed hyperlipidemia: Secondary | ICD-10-CM | POA: Diagnosis not present

## 2022-03-01 DIAGNOSIS — R7301 Impaired fasting glucose: Secondary | ICD-10-CM | POA: Diagnosis not present

## 2022-03-01 DIAGNOSIS — E669 Obesity, unspecified: Secondary | ICD-10-CM | POA: Diagnosis not present

## 2022-03-30 DIAGNOSIS — M1712 Unilateral primary osteoarthritis, left knee: Secondary | ICD-10-CM | POA: Diagnosis not present

## 2022-05-17 DIAGNOSIS — H35363 Drusen (degenerative) of macula, bilateral: Secondary | ICD-10-CM | POA: Diagnosis not present

## 2022-05-17 DIAGNOSIS — H5203 Hypermetropia, bilateral: Secondary | ICD-10-CM | POA: Diagnosis not present

## 2022-05-17 DIAGNOSIS — H524 Presbyopia: Secondary | ICD-10-CM | POA: Diagnosis not present

## 2022-08-31 DIAGNOSIS — N400 Enlarged prostate without lower urinary tract symptoms: Secondary | ICD-10-CM | POA: Diagnosis not present

## 2022-08-31 DIAGNOSIS — Z0000 Encounter for general adult medical examination without abnormal findings: Secondary | ICD-10-CM | POA: Diagnosis not present

## 2022-08-31 DIAGNOSIS — E782 Mixed hyperlipidemia: Secondary | ICD-10-CM | POA: Diagnosis not present

## 2022-08-31 DIAGNOSIS — E669 Obesity, unspecified: Secondary | ICD-10-CM | POA: Diagnosis not present

## 2022-08-31 DIAGNOSIS — R7301 Impaired fasting glucose: Secondary | ICD-10-CM | POA: Diagnosis not present

## 2022-08-31 DIAGNOSIS — K449 Diaphragmatic hernia without obstruction or gangrene: Secondary | ICD-10-CM | POA: Diagnosis not present

## 2022-08-31 DIAGNOSIS — Z125 Encounter for screening for malignant neoplasm of prostate: Secondary | ICD-10-CM | POA: Diagnosis not present

## 2023-11-18 DIAGNOSIS — R972 Elevated prostate specific antigen [PSA]: Secondary | ICD-10-CM | POA: Diagnosis not present

## 2024-01-24 DIAGNOSIS — H1033 Unspecified acute conjunctivitis, bilateral: Secondary | ICD-10-CM | POA: Diagnosis not present
